# Patient Record
Sex: Male | Born: 1990 | Race: Black or African American | Hispanic: No | Marital: Married | Smoking: Never smoker
Health system: Southern US, Community
[De-identification: ages and names within clinical notes are randomized; demographics above are authoritative.]

## PROBLEM LIST (undated history)

## (undated) DIAGNOSIS — M539 Dorsopathy, unspecified: Secondary | ICD-10-CM

---

## 2008-12-13 ENCOUNTER — Encounter: Admission: RE | Admit: 2008-12-13 | Discharge: 2008-12-13 | Payer: Self-pay | Admitting: Orthopaedic Surgery

## 2010-05-10 ENCOUNTER — Other Ambulatory Visit: Payer: Self-pay | Admitting: Family Medicine

## 2010-05-10 DIAGNOSIS — M545 Low back pain, unspecified: Secondary | ICD-10-CM

## 2010-05-15 ENCOUNTER — Ambulatory Visit
Admission: RE | Admit: 2010-05-15 | Discharge: 2010-05-15 | Disposition: A | Discharge status: Home / Self Care | Payer: PRIVATE HEALTH INSURANCE | Source: Ambulatory Visit | Attending: Family Medicine | Admitting: Family Medicine

## 2010-05-15 DIAGNOSIS — M545 Low back pain, unspecified: Secondary | ICD-10-CM

## 2010-09-18 ENCOUNTER — Emergency Department (HOSPITAL_COMMUNITY)
Admission: EM | Admit: 2010-09-18 | Discharge: 2010-09-18 | Disposition: A | Discharge status: Home / Self Care | Payer: PRIVATE HEALTH INSURANCE | Attending: Emergency Medicine | Admitting: Emergency Medicine

## 2010-09-18 ENCOUNTER — Emergency Department (HOSPITAL_COMMUNITY): Payer: PRIVATE HEALTH INSURANCE

## 2010-09-18 DIAGNOSIS — M25476 Effusion, unspecified foot: Secondary | ICD-10-CM | POA: Insufficient documentation

## 2010-09-18 DIAGNOSIS — M25579 Pain in unspecified ankle and joints of unspecified foot: Secondary | ICD-10-CM | POA: Insufficient documentation

## 2010-09-18 DIAGNOSIS — M25473 Effusion, unspecified ankle: Secondary | ICD-10-CM | POA: Insufficient documentation

## 2010-09-18 DIAGNOSIS — M722 Plantar fascial fibromatosis: Secondary | ICD-10-CM | POA: Insufficient documentation

## 2012-01-04 IMAGING — CR DG ANKLE COMPLETE 3+V*R*
3 series · 3 of 3 positions shown · non-contrast
Comparison: None.

CLINICAL DATA: Pain, no known injury.

RIGHT ANKLE - COMPLETE 3+ VIEW

[t ankle joint ap right]
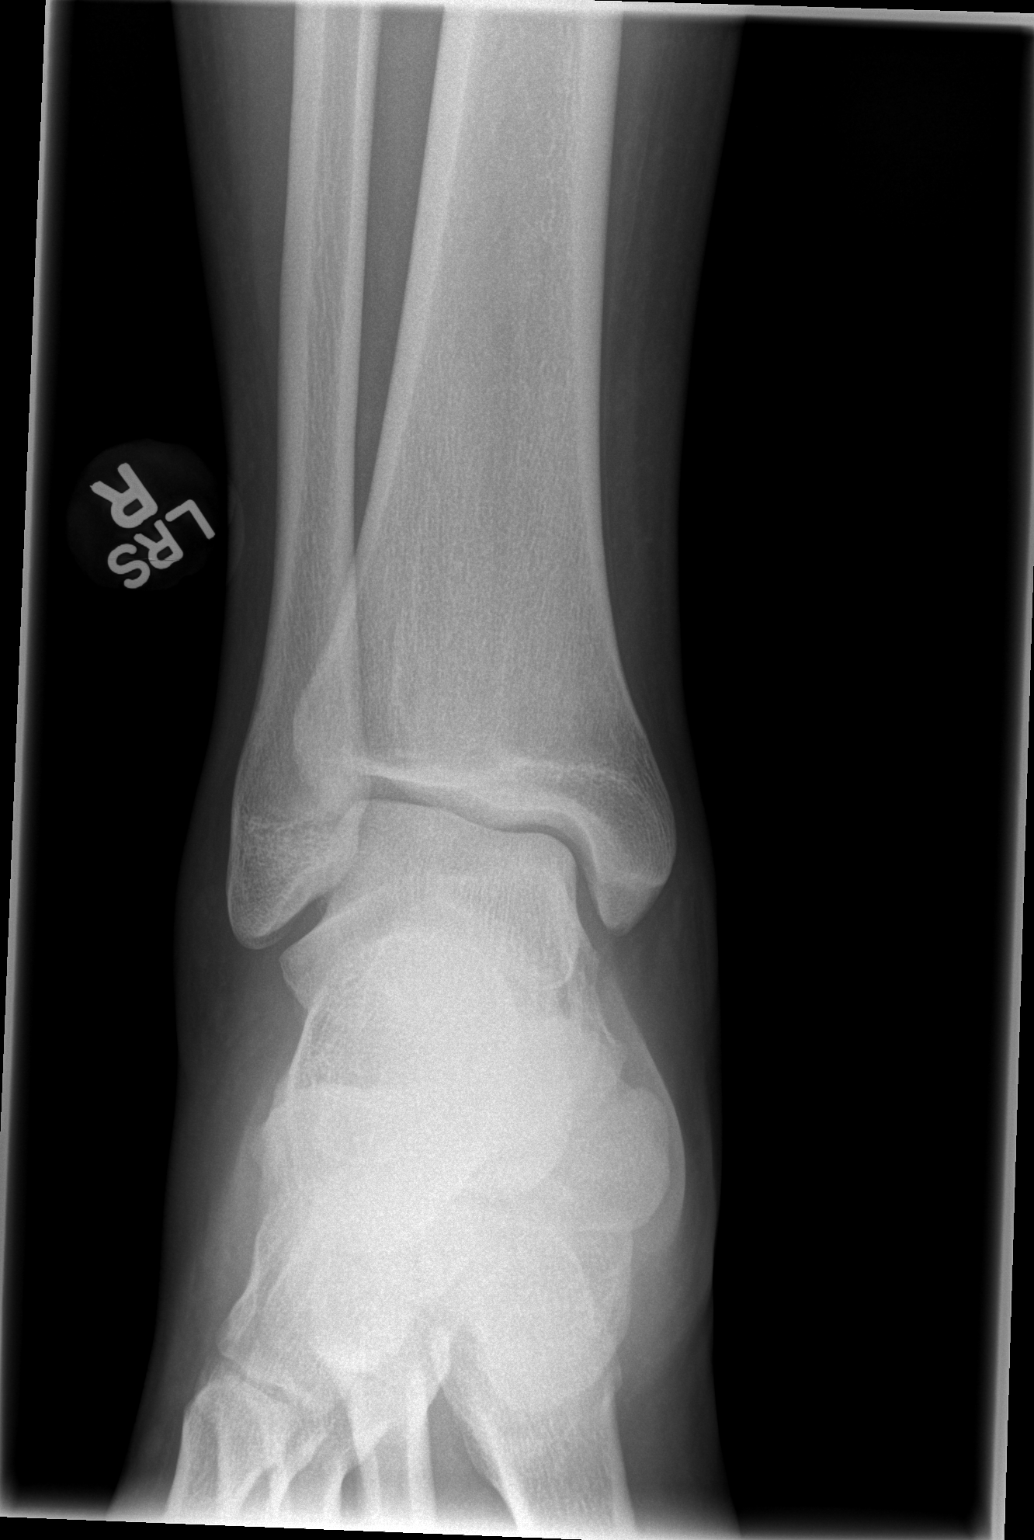

[t ankle joint oblique right]
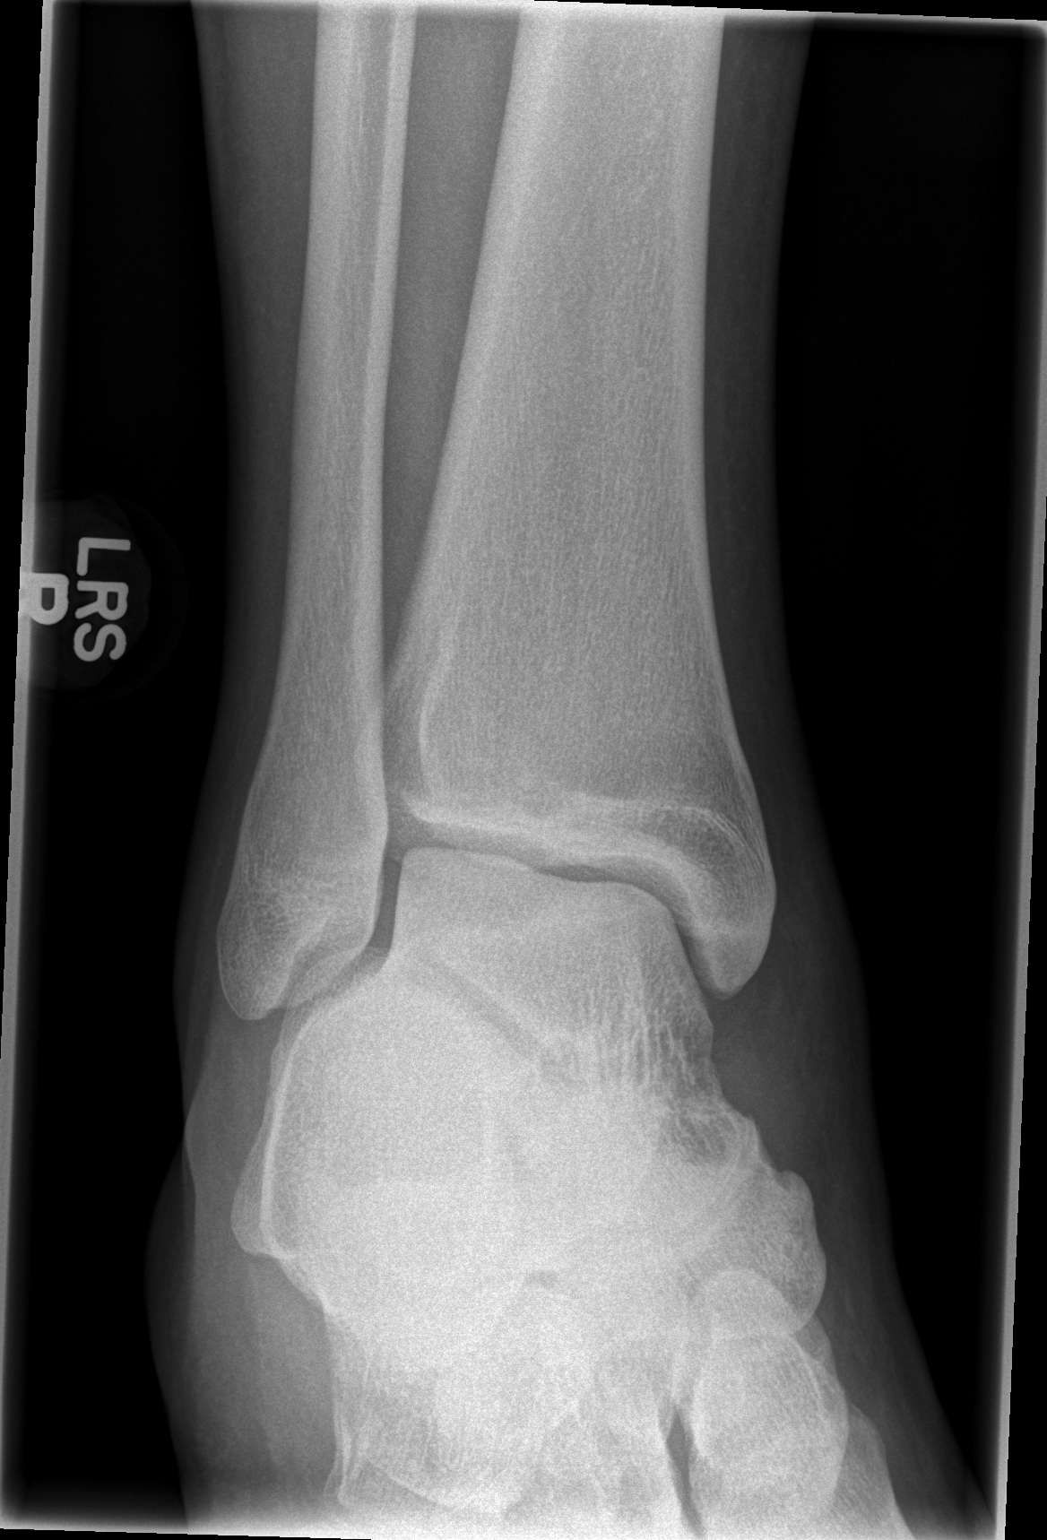

[t ankle joint lat right]
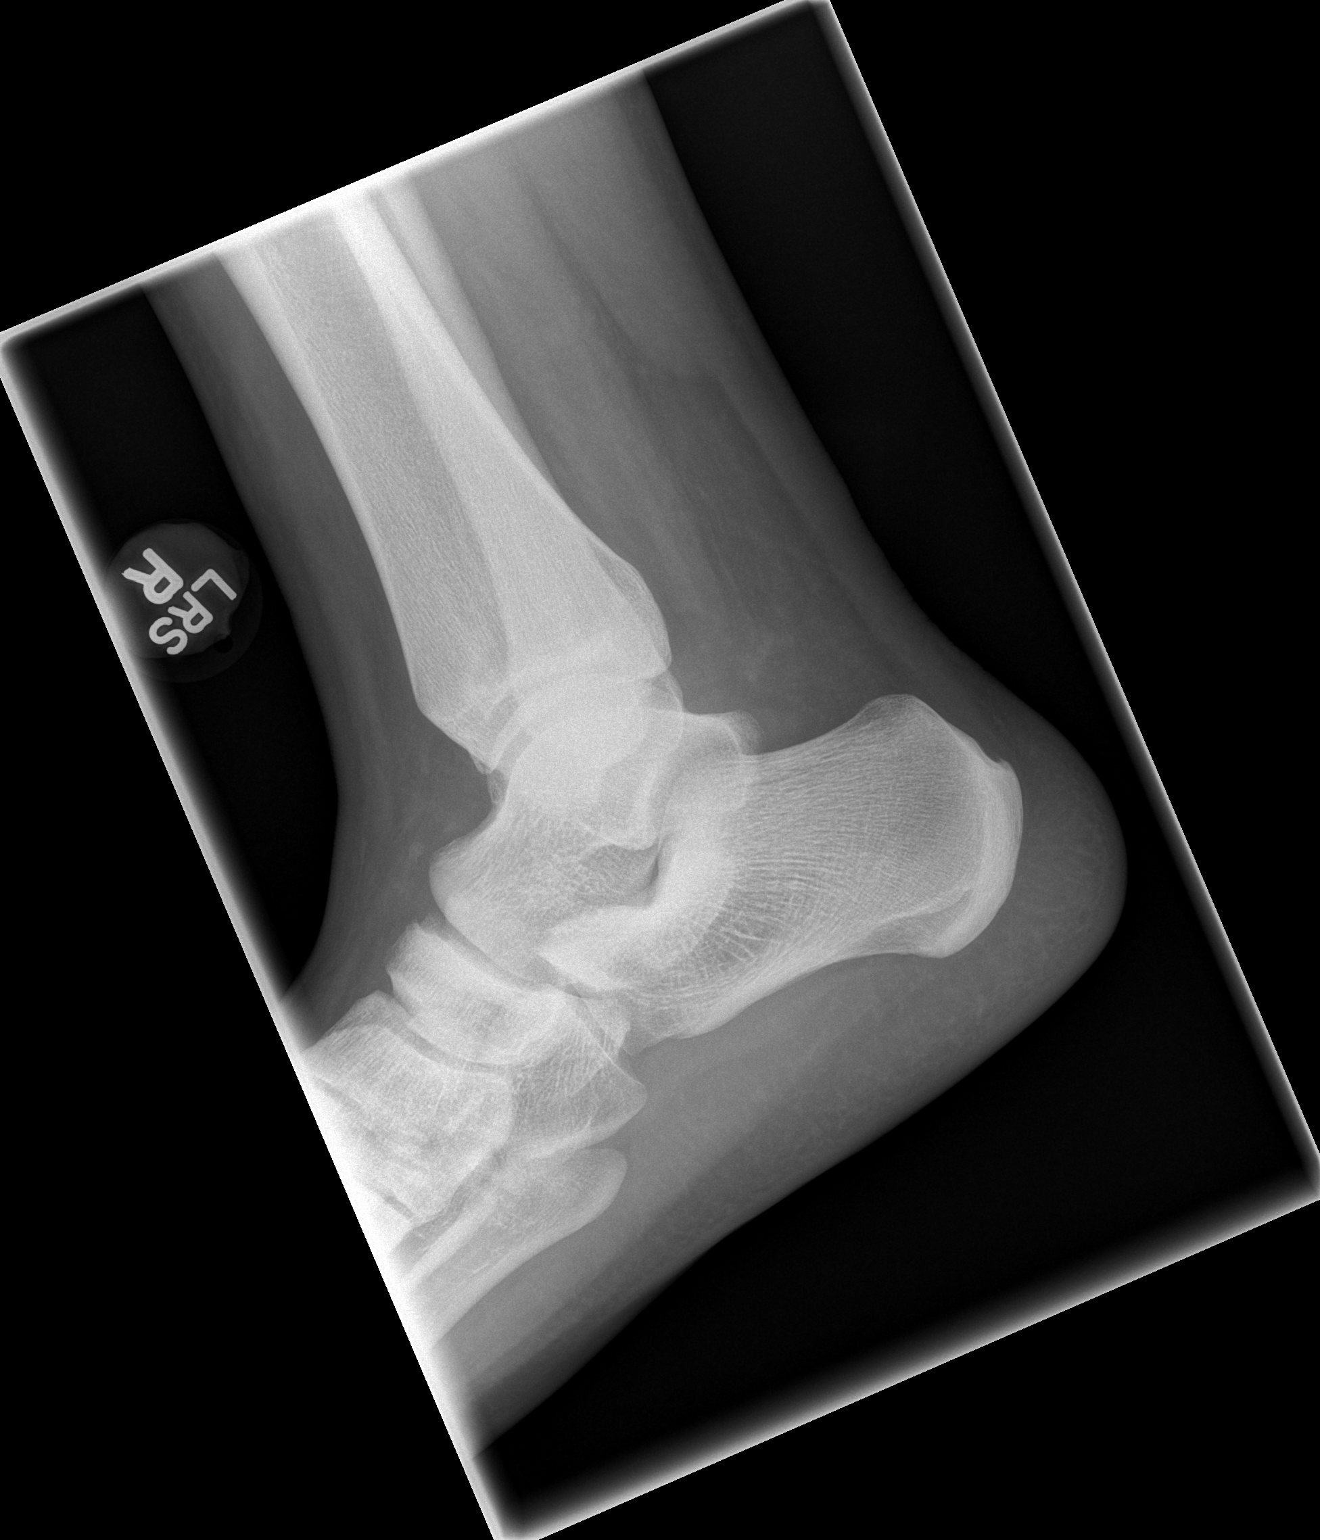

[3 of 3 positions shown; findings below may reference images not displayed]

FINDINGS: Diffuse soft tissue swelling. No displaced acute fracture
or dislocation identified. No aggressive appearing osseous lesion.
IMPRESSION: No acute osseous abnormality.

## 2014-08-24 ENCOUNTER — Encounter (HOSPITAL_COMMUNITY): Payer: Self-pay | Admitting: Emergency Medicine

## 2014-08-24 ENCOUNTER — Emergency Department (HOSPITAL_COMMUNITY)
Admission: EM | Admit: 2014-08-24 | Discharge: 2014-08-25 | Disposition: A | Discharge status: Home / Self Care | Payer: BC Managed Care – PPO | Attending: Emergency Medicine | Admitting: Emergency Medicine

## 2014-08-24 DIAGNOSIS — S299XXA Unspecified injury of thorax, initial encounter: Secondary | ICD-10-CM | POA: Insufficient documentation

## 2014-08-24 DIAGNOSIS — M549 Dorsalgia, unspecified: Secondary | ICD-10-CM

## 2014-08-24 DIAGNOSIS — Y9389 Activity, other specified: Secondary | ICD-10-CM | POA: Diagnosis not present

## 2014-08-24 DIAGNOSIS — Y92410 Unspecified street and highway as the place of occurrence of the external cause: Secondary | ICD-10-CM | POA: Insufficient documentation

## 2014-08-24 DIAGNOSIS — S3992XA Unspecified injury of lower back, initial encounter: Secondary | ICD-10-CM | POA: Insufficient documentation

## 2014-08-24 DIAGNOSIS — Y999 Unspecified external cause status: Secondary | ICD-10-CM | POA: Diagnosis not present

## 2014-08-24 HISTORY — DX: Dorsopathy, unspecified: M53.9
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
## 2014-08-25 DIAGNOSIS — S3992XA Unspecified injury of lower back, initial encounter: Secondary | ICD-10-CM | POA: Diagnosis not present

## 2014-08-25 MED ORDER — HYDROCODONE-ACETAMINOPHEN 5-325 MG PO TABS: 2.0000 | ORAL_TABLET | ORAL | Status: AC | PRN

## 2014-08-25 MED ORDER — METHOCARBAMOL 500 MG PO TABS: 500.0000 mg | ORAL_TABLET | Freq: Two times a day (BID) | ORAL | Status: AC

## 2014-08-25 MED ORDER — NAPROXEN 500 MG PO TABS
500.0000 mg | ORAL_TABLET | Freq: Two times a day (BID) | ORAL | Status: AC
Start: 2014-08-25 — End: ?

## 2014-08-25 MED ORDER — DIAZEPAM 5 MG PO TABS
5.0000 mg | ORAL_TABLET | Freq: Once | ORAL | Status: AC
  Administered 2014-08-25: 5 mg via ORAL
  Filled 2014-08-25: qty 1

## 2014-08-25 MED ORDER — KETOROLAC TROMETHAMINE 60 MG/2ML IM SOLN
60.0000 mg | Freq: Once | INTRAMUSCULAR | Status: AC
  Administered 2014-08-25: 60 mg via INTRAMUSCULAR
  Filled 2014-08-25: qty 2

## 2014-08-25 NOTE — Discharge Instructions (Signed)
Please take your pain medicines as prescribed. Do not take Robaxin or Norco before driving or interpreting machinery. Please follow-up with primary care for further evaluation and management of your symptoms. Return to ED for worsening symptoms.  Back Pain, Adult Low back pain is very common. About 1 in 5 people have back pain.The cause of low back pain is rarely dangerous. The pain often gets better over time.About half of people with a sudden onset of back pain feel better in just 2 weeks. About 8 in 10 people feel better by 6 weeks.  CAUSES Some common causes of back pain include:  Strain of the muscles or ligaments supporting the spine.  Wear and tear (degeneration) of the spinal discs.  Arthritis.  Direct injury to the back. DIAGNOSIS Most of the time, the direct cause of low back pain is not known.However, back pain can be treated effectively even when the exact cause of the pain is unknown.Answering your caregiver's questions about your overall health and symptoms is one of the most accurate ways to make sure the cause of your pain is not dangerous. If your caregiver needs more information, he or she may order lab work or imaging tests (X-rays or MRIs).However, even if imaging tests show changes in your back, this usually does not require surgery. HOME CARE INSTRUCTIONS For many people, back pain returns.Since low back pain is rarely dangerous, it is often a condition that people can learn to [facility] their own.   Remain active. It is stressful on the back to sit or stand in one place. Do not sit, drive, or stand in one place for more than 30 minutes at a time. Take short walks on level surfaces as soon as pain allows.Try to increase the length of time you walk each day.  Do not stay in bed.Resting more than 1 or 2 days can delay your recovery.  Do not avoid exercise or work.Your body is made to move.It is not dangerous to be active, even though your back may hurt.Your back  will likely heal faster if you return to being active before your pain is gone.  Pay attention to your body when you bend and lift. Many people have less discomfortwhen lifting if they bend their knees, keep the load close to their bodies,and avoid twisting. Often, the most comfortable positions are those that put less stress on your recovering back.  Find a comfortable position to sleep. Use a firm mattress and lie on your side with your knees slightly bent. If you lie on your back, put a pillow under your knees.  Only take over-the-counter or prescription medicines as directed by your caregiver. Over-the-counter medicines to reduce pain and inflammation are often the most helpful.Your caregiver may prescribe muscle relaxant drugs.These medicines help dull your pain so you can more quickly return to your normal activities and healthy exercise.  Put ice on the injured area.  Put ice in a plastic bag.  Place a towel between your skin and the bag.  Leave the ice on for 15-20 minutes, 03-04 times a day for the first 2 to 3 days. After that, ice and heat may be alternated to reduce pain and spasms.  Ask your caregiver about trying back exercises and gentle massage. This may be of some benefit.  Avoid feeling anxious or stressed.Stress increases muscle tension and can worsen back pain.It is important to recognize when you are anxious or stressed and learn ways to manage it.Exercise is a great option. SEEK MEDICAL  CARE IF:  You have pain that is not relieved with rest or medicine.  You have pain that does not improve in 1 week.  You have new symptoms.  You are generally not feeling well. SEEK IMMEDIATE MEDICAL CARE IF:   You have pain that radiates from your back into your legs.  You develop new bowel or bladder control problems.  You have unusual weakness or numbness in your arms or legs.  You develop nausea or vomiting.  You develop abdominal pain.  You feel  faint. Document Released: 02/10/2005 Document Revised: 08/12/2011 Document Reviewed: 06/14/2013 [facility] Patient Information 2015 [place], [place]. This information is not intended to replace advice given to you by your health care provider. Make sure you discuss any questions you have with your health care provider.
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
